# Patient Record
Sex: Female | Born: 1960 | Race: Black or African American | Hispanic: Yes | Marital: Married
Health system: Southern US, Community
[De-identification: ages and names within clinical notes are randomized; demographics above are authoritative.]

---

## 2018-06-14 ENCOUNTER — Emergency Department (HOSPITAL_COMMUNITY): Payer: BLUE CROSS/BLUE SHIELD

## 2018-06-14 ENCOUNTER — Emergency Department (HOSPITAL_COMMUNITY)
Admission: EM | Admit: 2018-06-14 | Discharge: 2018-06-14 | Disposition: A | Discharge status: Home / Self Care | Payer: BLUE CROSS/BLUE SHIELD | Attending: Emergency Medicine | Admitting: Emergency Medicine

## 2018-06-14 DIAGNOSIS — N210 Calculus in bladder: Secondary | ICD-10-CM | POA: Diagnosis not present

## 2018-06-14 DIAGNOSIS — N23 Unspecified renal colic: Secondary | ICD-10-CM

## 2018-06-14 DIAGNOSIS — Z79899 Other long term (current) drug therapy: Secondary | ICD-10-CM | POA: Diagnosis not present

## 2018-06-14 DIAGNOSIS — R109 Unspecified abdominal pain: Secondary | ICD-10-CM | POA: Diagnosis present

## 2018-06-14 LAB — CBC WITH DIFFERENTIAL/PLATELET
Abs Immature Granulocytes: 0.02 10*3/uL (ref 0.00–0.07)
Basophils Absolute: 0 10*3/uL (ref 0.0–0.1)
Basophils Relative: 0 %
Eosinophils Absolute: 0 10*3/uL (ref 0.0–0.5)
Eosinophils Relative: 0 %
HCT: 43.8 % (ref 36.0–46.0)
Hemoglobin: 14 g/dL (ref 12.0–15.0)
IMMATURE GRANULOCYTES: 0 %
Lymphocytes Relative: 18 %
Lymphs Abs: 1.1 10*3/uL (ref 0.7–4.0)
MCH: 30.2 pg (ref 26.0–34.0)
MCHC: 32 g/dL (ref 30.0–36.0)
MCV: 94.6 fL (ref 80.0–100.0)
MONOS PCT: 7 %
Monocytes Absolute: 0.4 10*3/uL (ref 0.1–1.0)
Neutro Abs: 4.4 10*3/uL (ref 1.7–7.7)
Neutrophils Relative %: 75 %
Platelets: 211 10*3/uL (ref 150–400)
RBC: 4.63 MIL/uL (ref 3.87–5.11)
RDW: 14.2 % (ref 11.5–15.5)
WBC: 5.9 10*3/uL (ref 4.0–10.5)
nRBC: 0 % (ref 0.0–0.2)

## 2018-06-14 LAB — BASIC METABOLIC PANEL
Anion gap: 12 (ref 5–15)
BUN: 13 mg/dL (ref 6–20)
CALCIUM: 9.6 mg/dL (ref 8.9–10.3)
CO2: 25 mmol/L (ref 22–32)
Chloride: 103 mmol/L (ref 98–111)
Creatinine, Ser: 1.06 mg/dL — ABNORMAL HIGH (ref 0.44–1.00)
GFR calc non Af Amer: 58 mL/min — ABNORMAL LOW (ref >60)
Glucose, Bld: 109 mg/dL — ABNORMAL HIGH (ref 70–99)
Potassium: 4 mmol/L (ref 3.5–5.1)
Sodium: 140 mmol/L (ref 135–145)

## 2018-06-14 LAB — URINALYSIS, ROUTINE W REFLEX MICROSCOPIC
Bilirubin Urine: NEGATIVE
Glucose, UA: NEGATIVE mg/dL
Ketones, ur: 80 mg/dL — AB
Leukocytes,Ua: NEGATIVE
NITRITE: NEGATIVE
Protein, ur: 100 mg/dL — AB
RBC / HPF: >50 RBC/hpf — ABNORMAL HIGH (ref 0–5)
Specific Gravity, Urine: 1.016 (ref 1.005–1.030)
pH: 6 (ref 5.0–8.0)

## 2018-06-14 MED ORDER — HYDROCODONE-ACETAMINOPHEN 5-325 MG PO TABS: 1.0000 | ORAL_TABLET | ORAL | 0 refills | Status: AC | PRN

## 2018-06-14 MED ORDER — ONDANSETRON 8 MG PO TBDP
8.0000 mg | ORAL_TABLET | Freq: Once | ORAL | Status: AC
  Administered 2018-06-14: 8 mg via ORAL
  Filled 2018-06-14: qty 1

## 2018-06-14 MED ORDER — ONDANSETRON HCL 4 MG PO TABS: 4.0000 mg | ORAL_TABLET | Freq: Four times a day (QID) | ORAL | 0 refills | Status: AC

## 2018-06-14 NOTE — ED Provider Notes (Signed)
[facility]-EMERGENCY DEPT Provider Note   CSN: [id number] Arrival date & time: 06/14/18  0243    History   Chief Complaint Chief Complaint  Patient presents with  . Flank Pain    HPI Rachael Serrano is a 58 y.o. female.     Patient presents to the emergency department for evaluation of right flank pain.  Symptoms began yesterday around 4 PM.  Patient reports that the pain was quite severe at onset.  She had nausea and vomiting associated with the pain.  After several hours the pain resolved.  While she was asleep, pain came back and awoke her.  She had severe pain once again, but now the pain has again resolved.  She has been feeling urinary frequency and urgency this morning.     No past medical history on file.  There are no active problems to display for this patient.      OB History   No obstetric history on file.      Home Medications    Prior to Admission medications   Medication Sig Start Date End Date Taking? Authorizing Provider  hydroxypropyl methylcellulose / hypromellose (ISOPTO TEARS / GONIOVISC) 2.5 % ophthalmic solution Place 1 drop into both eyes 3 (three) times daily as needed for dry eyes.   Yes [provider]  lactobacillus acidophilus (BACID) TABS tablet Take 2 tablets by mouth daily.   Yes [provider]  Multiple Vitamin (MULTIVITAMIN WITH MINERALS) TABS tablet Take 1 tablet by mouth daily.   Yes [provider]    Family History No family history on file.  Social History Social History   Tobacco Use  . Smoking status: Not on file  Substance Use Topics  . Alcohol use: Not on file  . Drug use: Not on file     Allergies   Patient has no known allergies.   Review of Systems Review of Systems  Gastrointestinal: Positive for nausea and vomiting.  Genitourinary: Positive for flank pain, frequency and urgency.  All other systems reviewed and are negative.    Physical Exam Updated  Vital Signs BP 120/78   Pulse 68   Temp 97.9 F (36.6 C) (Oral)   Resp 16   Ht  (1.676 m)   Wt 89.4 kg   SpO2 97%   BMI 31.80 kg/m   Physical Exam Vitals signs and nursing note reviewed.  Constitutional:      General: She is not in acute distress.    Appearance: Normal appearance. She is well-developed.  HENT:     Head: Normocephalic and atraumatic.     Right Ear: Hearing normal.     Left Ear: Hearing normal.     Nose: Nose normal.  Eyes:     Conjunctiva/sclera: Conjunctivae normal.     Pupils: Pupils are equal, round, and reactive to light.  Neck:     Musculoskeletal: Normal range of motion and neck supple.  Cardiovascular:     Rate and Rhythm: Regular rhythm.     Heart sounds: S1 normal and S2 normal. No murmur. No friction rub. No gallop.   Pulmonary:     Effort: Pulmonary effort is normal. No respiratory distress.     Breath sounds: Normal breath sounds.  Chest:     Chest wall: No tenderness.  Abdominal:     General: Bowel sounds are normal.     Palpations: Abdomen is soft.     Tenderness: There is no abdominal tenderness. There is no guarding  or rebound. Negative signs include Murphy's sign and McBurney's sign.     Hernia: No hernia is present.  Musculoskeletal: Normal range of motion.  Skin:    General: Skin is warm and dry.     Findings: No rash.  Neurological:     Mental Status: She is alert and oriented to person, place, and time.     GCS: GCS eye subscore is 4. GCS verbal subscore is 5. GCS motor subscore is 6.     Cranial Nerves: No cranial nerve deficit.     Sensory: No sensory deficit.     Coordination: Coordination normal.  Psychiatric:        Speech: Speech normal.        Behavior: Behavior normal.        Thought Content: Thought content normal.      ED Treatments / Results  Labs (all labs ordered are listed, but only abnormal results are displayed) Labs Reviewed  URINALYSIS, ROUTINE W REFLEX MICROSCOPIC - Abnormal; Notable for the  following components:      Result Value   APPearance CLOUDY (*)    Hgb urine dipstick LARGE (*)    Ketones, ur 80 (*)    Protein, ur 100 (*)    RBC / HPF >50 (*)    Bacteria, UA RARE (*)    All other components within normal limits  CBC WITH DIFFERENTIAL/PLATELET  BASIC METABOLIC PANEL    EKG None  Radiology Ct Renal Stone Study  Result Date: 06/14/2018 CLINICAL DATA:  58 year old female with right side abdominal and flank pain. EXAM: CT ABDOMEN AND PELVIS WITHOUT CONTRAST TECHNIQUE: Multidetector CT imaging of the abdomen and pelvis was performed following the standard protocol without IV contrast. COMPARISON:  None. FINDINGS: Lower chest: Calcified granuloma in the posterior right costophrenic angle. Minimal lung base atelectasis or scarring. No pericardial or pleural effusion. Hepatobiliary: Small 12 millimeter low-density area in the left dome on series 2, image 7 appears benign, perhaps a cyst or hemangioma. Mildly decreased liver density suspected. Negative gallbladder. Pancreas: Negative. Spleen: Negative. Adrenals/Urinary Tract: Normal adrenal glands. Negative noncontrast left kidney and proximal ureter. Left ureter is decompressed, and its distal course is difficult to delineate. There is mild to moderate right hydronephrosis and perinephric stranding. No calculus within the right kidney. There is mild to moderate right hydroureter to the level of the ureterovesical junction with mild periureteral stranding. Just inside the bladder located dependently on the right there is a 4-5 millimeter diameter stone. The bladder is decompressed and otherwise unremarkable. Stomach/Bowel: Negative large bowel, mildly redundant. Appendix is normal and best demonstrated on coronal image 89. Negative terminal ileum. No dilated small bowel. Small gastric hiatal hernia versus prominent phrenic ampulla. Otherwise negative stomach. No free air, free fluid. Vascular/Lymphatic: Vascular patency is not evaluated  in the absence of IV contrast. Numerous small pelvic phleboliths. No lymphadenopathy. Reproductive: Negative noncontrast appearance. Other: No pelvic free fluid. Musculoskeletal: Lumbar facet degeneration greater on the right. No acute osseous abnormality identified. IMPRESSION: 1. Acute obstructive uropathy on the right with a 4-5 mm calculus located just inside the bladder near the right UVJ. No other urologic calculus identified. 2. No other acute or inflammatory process identified in the non-contrast abdomen or pelvis. Electronically Signed   By: Odessa Fleming M.D.   On: 06/14/2018 06:55    Procedures Procedures (including critical care time)  Medications Ordered in ED Medications  ondansetron (ZOFRAN-ODT) disintegrating tablet 8 mg (8 mg Oral Given 06/14/18 0307)  Initial Impression / Assessment and Plan / ED Course  I have reviewed the triage vital signs and the nursing notes.  Pertinent labs & imaging results that were available during my care of the patient were reviewed by me and considered in my medical decision making (see chart for details).        Patient presents to the emergency department for evaluation of right flank pain.  Patient has had 2 episodes of severe pain but by the time I evaluated her, pain had resolved.  Urinalysis shows hematuria without signs of infection.  Blood work unremarkable.  CT scan shows what appears to be recently passed 4-5 mm stone in the bladder, no other abnormalities noted.  Final Clinical Impressions(s) / ED Diagnoses   Final diagnoses:  Ureteral colic    ED Discharge Orders    None       Gilda Crease, MD 06/14/18 [phone]

## 2018-06-14 NOTE — ED Triage Notes (Addendum)
Pt reports right flank pain that began 06/13/2018 with N/V. Pt reports pain went away but came back more severe while she was asleep. Pt has hx of kidney stones but denies similar sx. Pt also reports urinary urgency.

## 2020-10-25 IMAGING — CT CT RENAL STONE PROTOCOL
2 of 4 series · 16 of 46 positions shown, 18 images · non-contrast
Comparison: None.

CLINICAL DATA: 58-year-old female with right side abdominal and
flank pain.

EXAM:
CT ABDOMEN AND PELVIS WITHOUT CONTRAST
TECHNIQUE: Multidetector CT imaging of the abdomen and pelvis was performed
following the standard protocol without IV contrast.

[Series 2: axial st · axial · 0.97mm/px · z∈[+1028,+1418]mm · 13 of 88 slices shown, 15 images]
[im 5/88  soft-tissue]
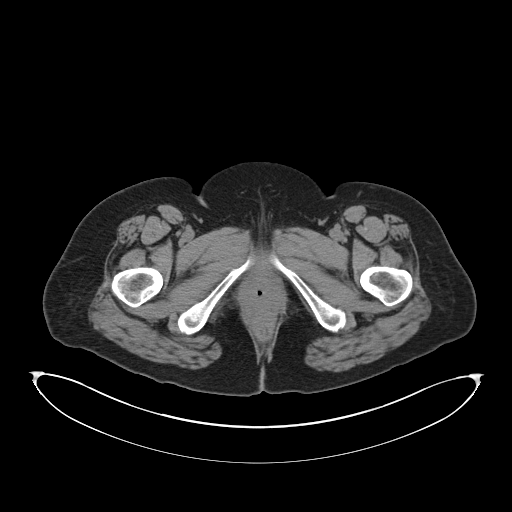
[im 5/88  bone]
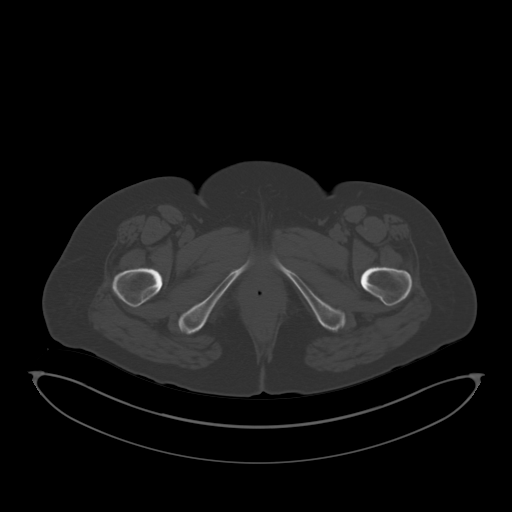
[im 13/88  soft-tissue]
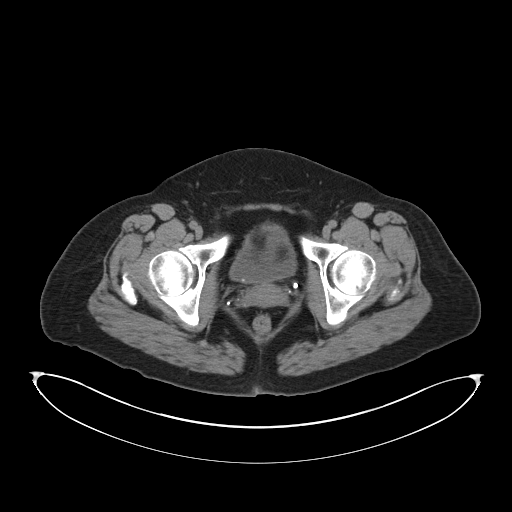
[im 17/88  soft-tissue]
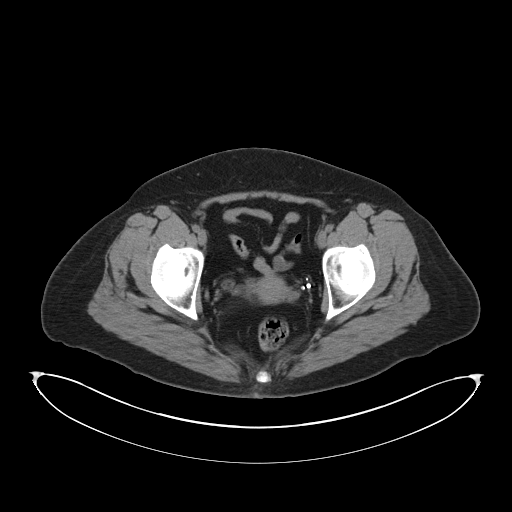
[im 25/88  soft-tissue]
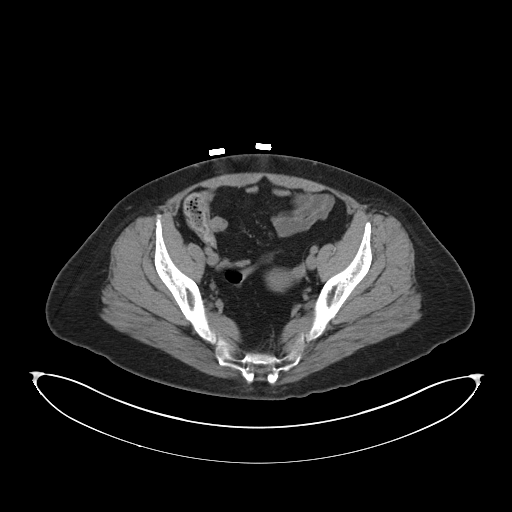
[im 30/88  soft-tissue]
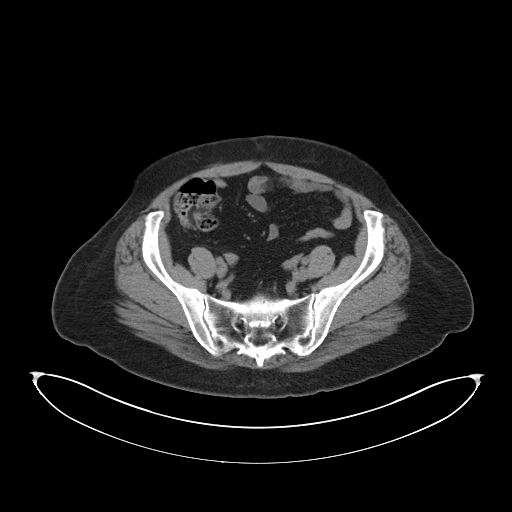
[im 38/88  soft-tissue]
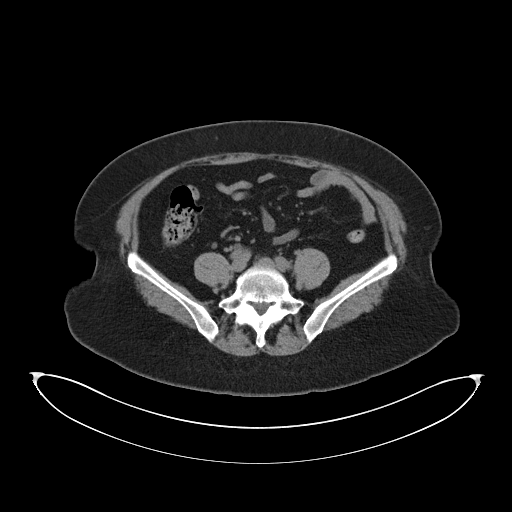
[im 46/88  soft-tissue]
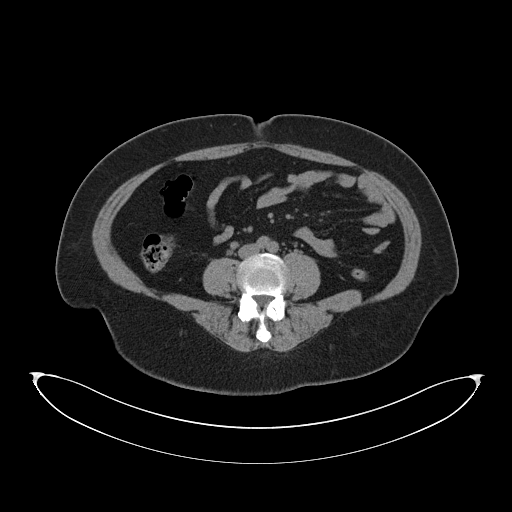
[im 50/88  soft-tissue]
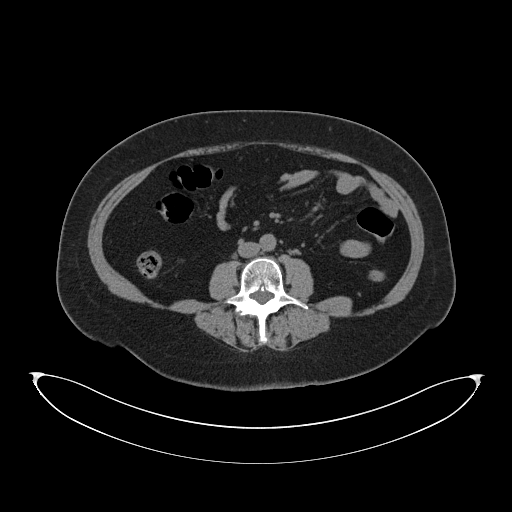
[im 59/88  soft-tissue]
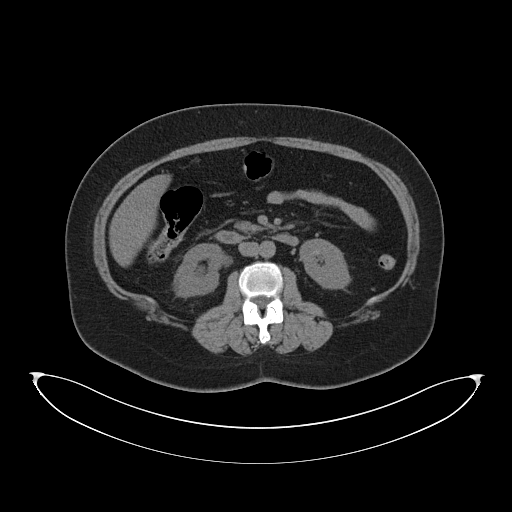
[im 59/88  bone]
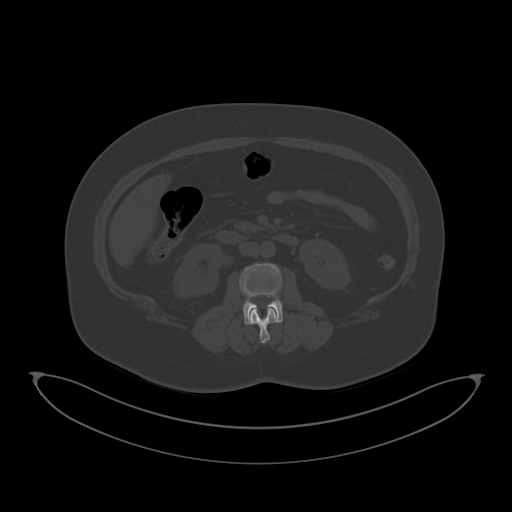
[im 63/88  soft-tissue]
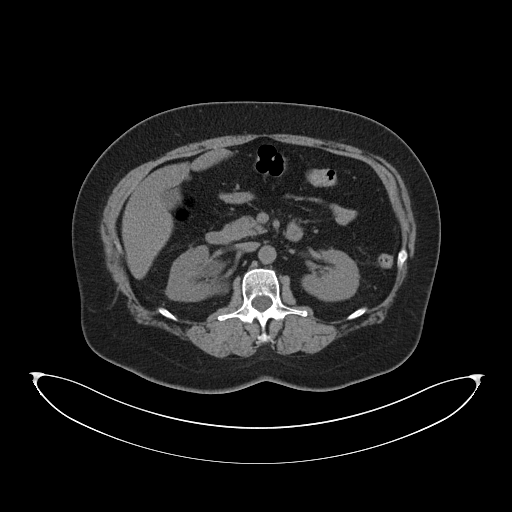
[im 71/88  soft-tissue]
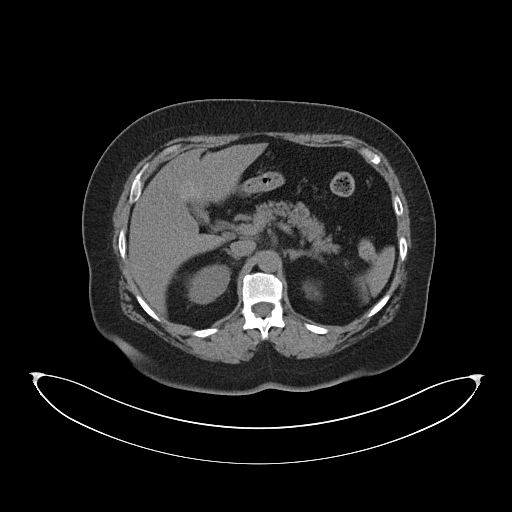
[im 75/88  soft-tissue]
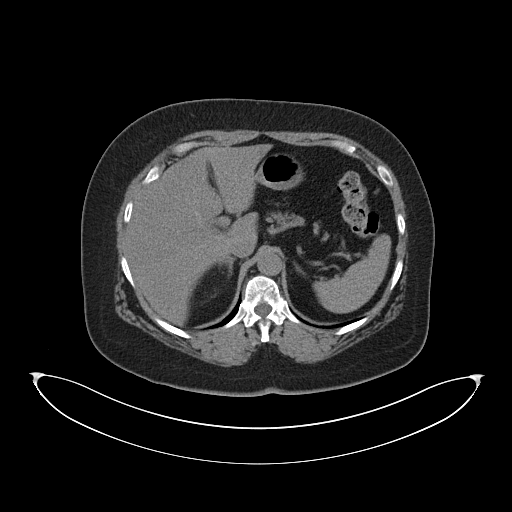
[im 83/88  soft-tissue]
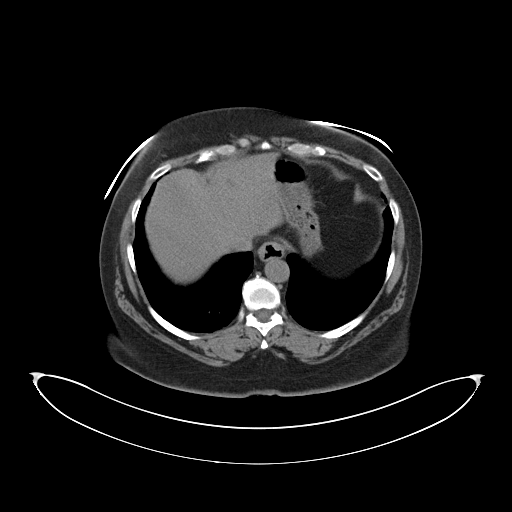

[Series 4: coronal · coronal · 0.86mm/px · 3 of 183 slices shown]
[im 61/183  soft-tissue]
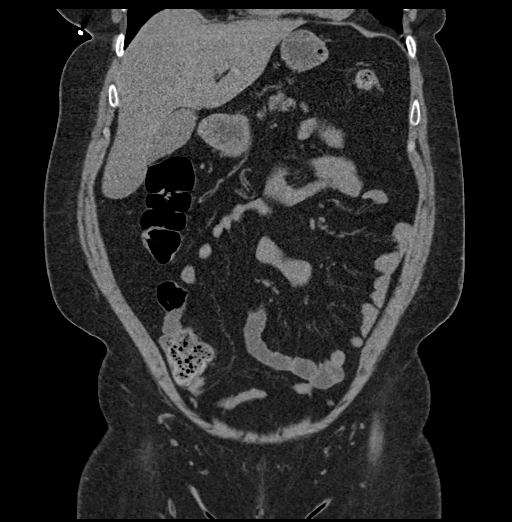
[im 81/183  soft-tissue]
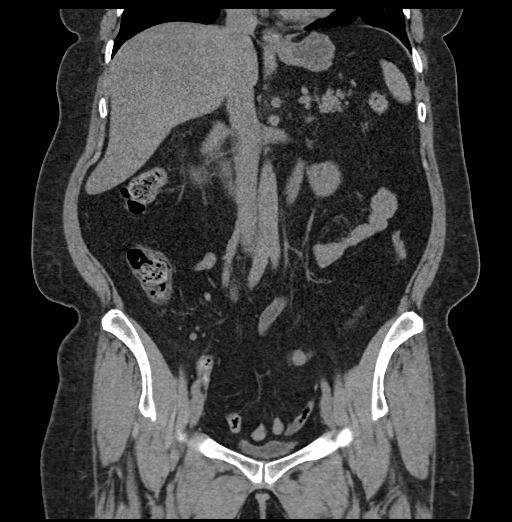
[im 102/183  soft-tissue]
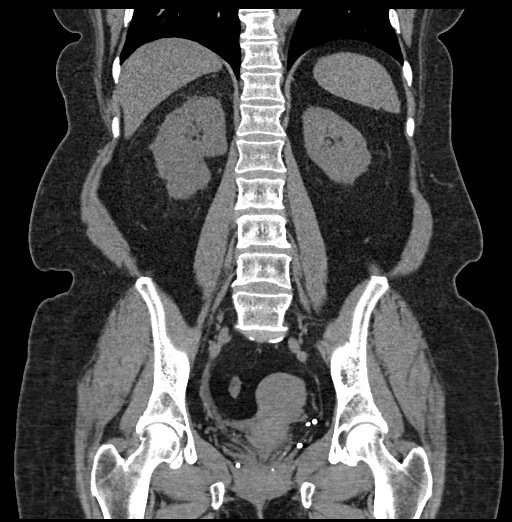

[16 of 46 positions shown; findings below may reference images not displayed]

FINDINGS: Lower chest: Calcified granuloma in the posterior right costophrenic
angle. Minimal lung base atelectasis or scarring. No pericardial or
pleural effusion.

Hepatobiliary: Small 12 millimeter low-density area in the left dome
on series 2, image 7 appears benign, perhaps a cyst or hemangioma.
Mildly decreased liver density suspected. Negative gallbladder.

Pancreas: Negative.

Spleen: Negative.

Adrenals/Urinary Tract: Normal adrenal glands.

Negative noncontrast left kidney and proximal ureter. Left ureter is
decompressed, and its distal course is difficult to delineate.

There is mild to moderate right hydronephrosis and perinephric
stranding. No calculus within the right kidney. There is mild to
moderate right hydroureter to the level of the ureterovesical
junction with mild periureteral stranding. Just inside the bladder
located dependently on the right there is a 4-5 millimeter diameter
stone. The bladder is decompressed and otherwise unremarkable.

Stomach/Bowel: Negative large bowel, mildly redundant. Appendix is
normal and best demonstrated on coronal image 89. Negative terminal
ileum.

No dilated small bowel. Small gastric hiatal hernia versus prominent
phrenic ampulla. Otherwise negative stomach.

No free air, free fluid.

Vascular/Lymphatic: Vascular patency is not evaluated in the absence
of IV contrast. Numerous small pelvic phleboliths.

No lymphadenopathy.

Reproductive: Negative noncontrast appearance.

Other: No pelvic free fluid.

Musculoskeletal: Lumbar facet degeneration greater on the right. No
acute osseous abnormality identified.
IMPRESSION: 1. Acute obstructive uropathy on the right with a 4-5 mm calculus
located just inside the bladder near the right UVJ. No other
urologic calculus identified.
2. No other acute or inflammatory process identified in the
non-contrast abdomen or pelvis.
